# Patient Record
Sex: Male | Born: 1995 | Race: White | Hispanic: No | Marital: Single | Smoking: Never smoker
Health system: Southern US, Community
[De-identification: ages and names within clinical notes are randomized; demographics above are authoritative.]

## PROBLEM LIST (undated history)

## (undated) HISTORY — PX: PAROTIDECTOMY: SHX2163

---
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
## 2016-12-25 ENCOUNTER — Encounter (INDEPENDENT_AMBULATORY_CARE_PROVIDER_SITE_OTHER): Payer: Self-pay | Admitting: Pediatrics

## 2016-12-25 ENCOUNTER — Ambulatory Visit (INDEPENDENT_AMBULATORY_CARE_PROVIDER_SITE_OTHER): Payer: BLUE CROSS/BLUE SHIELD | Admitting: Pediatrics

## 2016-12-25 VITALS — BP 100/60 | Ht 60.0 in | Wt 90.0 lb

## 2016-12-25 DIAGNOSIS — F840 Autistic disorder: Secondary | ICD-10-CM | POA: Insufficient documentation

## 2016-12-25 DIAGNOSIS — G8250 Quadriplegia, unspecified: Secondary | ICD-10-CM | POA: Insufficient documentation

## 2016-12-25 DIAGNOSIS — F6381 Intermittent explosive disorder: Secondary | ICD-10-CM | POA: Diagnosis not present

## 2016-12-25 MED ORDER — CLONIDINE HCL 0.1 MG PO TABS: ORAL_TABLET | ORAL | 5 refills | Status: AC

## 2016-12-25 NOTE — Progress Notes (Signed)
Patient: Johnathan Fuller MRN: [id number] Sex: male DOB: 04/17/1995  Provider: Ellison CarwinWilliam Sophia Sperry, MD [facility]  Note type: New patient consultation  History of Present Illness: Referral Source: Dr. Nyoka CowdenLaurie MacDonald History from: both parents, patient and referring office Chief Complaint: PDD: behavior Disturbance; Autistic  Johnathan Fuller is a 21 y.o. male who was evaluated on December 25, 2016.  Consultation received in my office on December 15, 2016.  I was asked by his primary provider, Dr. Arlyn LeakLaurie McDonald, to evaluate [place].  Johnathan Fuller was injured at birth by persistent hypoglycemia.  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  In the recent naming system, everything falls under autism spectrum disorder which is why I use that label and explained it to his parents.  There are times when Johnathan Fuller becomes agitated and has facial pallor and diaphoresis.  He also has heavy breathing.  He has not acted as if he is in pain in terms of crying out.  He has not had any nausea or vomiting.  These symptoms have persisted over the past 18 months and now occur once a week or more often.  There seems to be no trigger for this.    He has an aversion to loud high-pitched noises, but loves vacuum cleaners or hair dryers.  When he enters one of these episodes, it is not possible to distract him from that.  Episodes can last over 1 hour and stop spontaneously.  He seems to do best if he is left alone to recover on his own.  His parents do not feel that these episodes represent migraines.  There is a legitimate question as to what they do represent.  He has other times when he becomes upset such as when he is told "no" or in some other was becomes frustrated.  It is possible to distract him out of those more minor tantrums.   His family has been in this region since he was 21 years of age, although  they have not sought care of a neurologist.  He became very upset when his main teacher left last year.  The aide became his teacher.  He is at [employer] and has a male bus driver and a male Runner, broadcasting/film/videoteacher.  It has taken some time for him to get used to that.  As regards to the history of migraines, mother and maternal aunt, a maternal first cousin, and maternal nephew all have migraines.  Elston's parents are very happy with the school at [employer].  They feel that he is getting adequate support.  They are realistic in understanding that does not translate into improving his cognition or his activities of daily living.  He does have some things that he can do for himself, but they are limited.  He has a fairly restricted menu of foods that he will eat.  Review of Systems: A complete review of systems was remarkable for nosebleeds, excema, all other systems reviewed and negative.   Review of Systems  Constitutional: Negative.   HENT: Positive for nosebleeds.   Eyes:       Disconjugate eye movements  Respiratory: Negative.   Cardiovascular: Negative.   Gastrointestinal: Negative.   Genitourinary: Negative.   Musculoskeletal: Negative.   Skin: Positive for rash.       eczema  Endo/Heme/Allergies: Negative.   Psychiatric/Behavioral: The patient is nervous/anxious.    Past Medical History History  reviewed. No pertinent past medical history. Hospitalizations: Yes.  , Head Injury: No., Nervous System Infections: No., Immunizations up to date: Yes.    Birth History 6 Lbs. 12 oz. infant born at 36-1/[redacted] weeks gestational age to a 20 year old g 1 0 male. 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 PAROTIDECTOMY     Family History family history includes Cancer in his paternal grandfather; Cirrhosis in his maternal grandmother. Family history is negative for migraines, seizures, intellectual disabilities, blindness, deafness, birth defects, chromosomal disorder, or autism.  Social History Social History   Social History  . Marital status: Single  . Years of education:  14   Social History Main Topics  . Smoking status: Never Smoker  . Smokeless tobacco: Never Used  . Alcohol use None  . Drug use: Unknown  . 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    He attends [employer].    He lives with both parents.    He has one sister   No Known Allergies  Physical Exam BP 100/60   Ht 5' (1.524 m)   Wt 90 lb (40.8 kg)   BMI 17.58 kg/m  HC: 53.9 cm  General: alert, well developed, thin, in no acute distress, brown hair, brown eyes, even-handed Head: normocephalic, no dysmorphic features Ears, Nose and Throat: Otoscopic: tympanic membranes normal; pharynx: oropharynx is pink without exudates or tonsillar hypertrophy Neck: supple, full range of motion, no cranial or cervical bruits Respiratory: auscultation clear Cardiovascular: no murmurs, pulses are normal Musculoskeletal: no skeletal deformities or apparent scoliosis Skin: no rashes or neurocutaneous lesions  Neurologic Exam  Mental Status: alert; unable to speak, limited ability to follow commands, agitated Cranial Nerves: visual fields are full to double simultaneous stimuli; extraocular movements are full and dysconjugate, left esotropia; pupils are round reactive to light; funduscopic examination positive red reflex bilaterally; symmetric facial strength; midline tongue; turns to localize sound bilaterally Motor: 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 2. Autism spectrum disorder with accompanying language impairment and intellectual disability, requiring substantial support (level 2), F84.0. 3. 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  Discussion I do not believe that the episodes that have been witnessed represent some form of epileptic behavior.  He is not losing contact with his world.  I also do not believe that the episodes represent migraine because he is not typically crying out in pain.  He has also not had any vomiting.  Plan I discussed at length medications that might modify his behavior.  Particularly, we discussed Risperdal and clonidine.  After listening to benefits and side effects, mother opted for [place] to start clonidine in the morning to see if this will calm him and clonidine to give him as needed if he becomes agitated in places where it would be problematic such as the car.  I do not think performing other testing is going to be helpful at this time.  If, however, conditions change, I would be happy to revisit this and assist  in any evaluation that was medically necessary.  He will return to see me in 3 months' time.  I will see him sooner based on clinical need.   Medication List   Accurate as of 12/25/16 11:59 PM.      cloNIDine 0.1 MG tablet Commonly known as:  CATAPRES Give 1/2 tablet each morning before school and 1/2 tablet 1/2-3/4 of an hour before car rides.    The medication list was reviewed and reconciled. All changes or newly prescribed medications were explained.  A complete medication list was provided to the patient/caregiver.  Deetta Perla MD

## 2016-12-25 NOTE — Patient Instructions (Signed)
It was a pleasure to meet you today.  I want you to sign up for My Chart so that you can communicate with me meet you today directly into his chart.  We will try low-dose clonidine to see whether it calms him and whether there are any other side effects such as making him tired or sleepy.  This may work to your advantage in car trips, but may be unacceptable for the regular school day.

## 2017-02-09 ENCOUNTER — Ambulatory Visit (INDEPENDENT_AMBULATORY_CARE_PROVIDER_SITE_OTHER): Payer: Self-pay | Admitting: Pediatrics
